# Patient Record
Sex: Female | Born: 1980 | Race: White | Hispanic: No | Marital: Married | Smoking: Never smoker
Health system: Southern US, Community
[De-identification: ages and names within clinical notes are randomized; demographics above are authoritative.]

## PROBLEM LIST (undated history)

## (undated) DIAGNOSIS — Z98891 History of uterine scar from previous surgery: Secondary | ICD-10-CM

---

## 2004-07-09 ENCOUNTER — Other Ambulatory Visit: Admission: RE | Admit: 2004-07-09 | Discharge: 2004-07-09 | Payer: Self-pay | Admitting: Family Medicine

## 2012-04-26 LAB — OB RESULTS CONSOLE RPR
RPR: NONREACTIVE
RPR: NONREACTIVE

## 2012-04-26 LAB — OB RESULTS CONSOLE ANTIBODY SCREEN: Antibody Screen: NEGATIVE

## 2012-04-26 LAB — OB RESULTS CONSOLE HIV ANTIBODY (ROUTINE TESTING)
HIV: NONREACTIVE
HIV: NONREACTIVE

## 2012-04-26 LAB — OB RESULTS CONSOLE RUBELLA ANTIBODY, IGM: Rubella: IMMUNE

## 2012-10-02 LAB — OB RESULTS CONSOLE GBS: GBS: POSITIVE

## 2012-11-03 ENCOUNTER — Inpatient Hospital Stay (HOSPITAL_COMMUNITY)
Admission: AD | Admit: 2012-11-03 | Payer: Managed Care, Other (non HMO) | Source: Ambulatory Visit | Admitting: Obstetrics and Gynecology

## 2012-11-03 LAB — OB RESULTS CONSOLE GC/CHLAMYDIA: Gonorrhea: NEGATIVE

## 2012-11-17 ENCOUNTER — Telehealth (HOSPITAL_COMMUNITY): Payer: Self-pay | Admitting: *Deleted

## 2012-11-17 ENCOUNTER — Encounter (HOSPITAL_COMMUNITY): Payer: Self-pay | Admitting: *Deleted

## 2012-11-17 NOTE — Telephone Encounter (Signed)
Preadmission screen  

## 2012-11-20 ENCOUNTER — Inpatient Hospital Stay (HOSPITAL_COMMUNITY)
Admission: RE | Admit: 2012-11-20 | Discharge: 2012-11-23 | DRG: 766 | Disposition: A | Discharge status: Home / Self Care | Payer: 59 | Source: Ambulatory Visit | Attending: Obstetrics | Admitting: Obstetrics

## 2012-11-20 ENCOUNTER — Encounter (HOSPITAL_COMMUNITY): Payer: Self-pay

## 2012-11-20 VITALS — BP 112/67 | HR 80 | Temp 97.9°F | Resp 18 | Ht 67.0 in | Wt 176.0 lb

## 2012-11-20 DIAGNOSIS — O9903 Anemia complicating the puerperium: Secondary | ICD-10-CM | POA: Diagnosis not present

## 2012-11-20 DIAGNOSIS — Z98891 History of uterine scar from previous surgery: Secondary | ICD-10-CM | POA: Diagnosis not present

## 2012-11-20 DIAGNOSIS — O99892 Other specified diseases and conditions complicating childbirth: Secondary | ICD-10-CM | POA: Diagnosis present

## 2012-11-20 DIAGNOSIS — O480 Post-term pregnancy: Principal | ICD-10-CM | POA: Diagnosis present

## 2012-11-20 DIAGNOSIS — O34219 Maternal care for unspecified type scar from previous cesarean delivery: Secondary | ICD-10-CM | POA: Diagnosis present

## 2012-11-20 DIAGNOSIS — D649 Anemia, unspecified: Secondary | ICD-10-CM | POA: Diagnosis not present

## 2012-11-20 DIAGNOSIS — Z22330 Carrier of Group B streptococcus: Secondary | ICD-10-CM

## 2012-11-20 DIAGNOSIS — O9902 Anemia complicating childbirth: Secondary | ICD-10-CM | POA: Diagnosis not present

## 2012-11-20 HISTORY — DX: History of uterine scar from previous surgery: Z98.891

## 2012-11-20 LAB — CBC
Hemoglobin: 11.2 g/dL — ABNORMAL LOW (ref 12.0–15.0)
MCH: 31.3 pg (ref 26.0–34.0)
MCV: 92.2 fL (ref 78.0–100.0)
RBC: 3.58 MIL/uL — ABNORMAL LOW (ref 3.87–5.11)

## 2012-11-20 LAB — TYPE AND SCREEN: Antibody Screen: NEGATIVE

## 2012-11-20 MED ORDER — CITRIC ACID-SODIUM CITRATE 334-500 MG/5ML PO SOLN
30.0000 mL | ORAL | Status: DC | PRN
  Administered 2012-11-21: 30 mL via ORAL
  Filled 2012-11-20: qty 15

## 2012-11-20 MED ORDER — OXYTOCIN 40 UNITS IN LACTATED RINGERS INFUSION - SIMPLE MED: 62.5000 mL/h | INTRAVENOUS | Status: DC

## 2012-11-20 MED ORDER — ACETAMINOPHEN 325 MG PO TABS: 650.0000 mg | ORAL_TABLET | ORAL | Status: DC | PRN

## 2012-11-20 MED ORDER — LIDOCAINE HCL (PF) 1 % IJ SOLN: 30.0000 mL | INTRAMUSCULAR | Status: DC | PRN

## 2012-11-20 MED ORDER — ONDANSETRON HCL 4 MG/2ML IJ SOLN: 4.0000 mg | Freq: Four times a day (QID) | INTRAMUSCULAR | Status: DC | PRN

## 2012-11-20 MED ORDER — PENICILLIN G POTASSIUM 5000000 UNITS IJ SOLR
5.0000 10*6.[IU] | Freq: Once | INTRAVENOUS | Status: AC
  Administered 2012-11-20: 5 10*6.[IU] via INTRAVENOUS
  Filled 2012-11-20: qty 5

## 2012-11-20 MED ORDER — LACTATED RINGERS IV SOLN
INTRAVENOUS | Status: DC
  Administered 2012-11-20: 21:00:00 via INTRAVENOUS

## 2012-11-20 MED ORDER — OXYTOCIN BOLUS FROM INFUSION: 500.0000 mL | INTRAVENOUS | Status: DC

## 2012-11-20 MED ORDER — LACTATED RINGERS IV SOLN: 500.0000 mL | INTRAVENOUS | Status: DC | PRN

## 2012-11-20 MED ORDER — OXYCODONE-ACETAMINOPHEN 5-325 MG PO TABS: 1.0000 | ORAL_TABLET | ORAL | Status: DC | PRN

## 2012-11-20 MED ORDER — IBUPROFEN 600 MG PO TABS: 600.0000 mg | ORAL_TABLET | Freq: Four times a day (QID) | ORAL | Status: DC | PRN

## 2012-11-20 MED ORDER — PENICILLIN G POTASSIUM 5000000 UNITS IJ SOLR
2.5000 10*6.[IU] | INTRAMUSCULAR | Status: DC
  Administered 2012-11-21 (×3): 2.5 10*6.[IU] via INTRAVENOUS
  Filled 2012-11-20 (×7): qty 2.5

## 2012-11-20 NOTE — Progress Notes (Signed)
Comfortable with irregular contractions. O: VSS      fhts category 1, baseline 155 bpm      abd soft between uc      Contractions 1: 4 mins       Vag: 1.5/ 60%/-2, Vx       Placement of  Cervical balloon with 60 ml balloon - to water traction      1st dose of Pen G - GBS prophylaxis infusing A: IOL, TOLAC, desired VBAC at [redacted]w[redacted]d Cervical balloon in place.       P: EFM continuous until Cervical Balloon has fallen out.     Plan to AROM s/p cervical Balloon     GBS prophylaxis      Earl Gala, CNM.

## 2012-11-20 NOTE — H&P (Signed)
Shari Chang is a 31 y.o. female, G2P1 at [redacted]w[redacted]d, presenting for IOL with desire to [facility]. Plan to place a Cervical Balloon tonight. Previous C/S at 40 weeks, [place].   There is no problem list on file for this patient.   History of present pregnancy: Patient entered care at [redacted]w[redacted]d   [facility] of 11/03/12 was established by LMP and USS.   Anatomy scan: With Ultra screen and NT testing. Anantomy weeks [redacted]w[redacted]d, with normal findings and an anterior placenta.   Additional [place] evaluations: AFI with unilateral CP cyst. [place] at 28 weeks and CP cyst resolved.   Significant prenatal events:Post dates - at [redacted]w[redacted]d  As per patient request. Had had NSTs and BPP's evaluation this weeks once GA at 42 weeks   Last evaluation:  11/19/12   OB History    Grav Para Term Preterm Abortions TAB SAB Ect Mult Living   2 1 1       1      No past medical history on file. Past Surgical History  Procedure Date  . Cesarean section    Family History: family history includes Asthma in her sister; Cancer in her father; Endometriosis in her maternal grandmother, mother, and sister; and Rheum arthritis in her sister. Social History:  reports that she has never smoked. She has never used smokeless tobacco. She reports that she does not drink alcohol or use illicit drugs.   Prenatal Transfer Tool  Maternal Diabetes: No Genetic Screening: Normal Maternal Ultrasounds/Referrals: Abnormal:  Findings:   Other: post dates surveillance Fetal Ultrasounds or other Referrals:  None Maternal Substance Abuse:  No Significant Maternal Medications:  None Significant Maternal Lab Results: None    ROS:  Affect: AAO x 3 Lungs; CTAB CV: RRR Abdomen: Gravid and N/T soft between contractions GU: no problems voiding GI: normal Extremities: No edema , no swelling bilaterally.  No Known Allergies     There were no vitals taken for this visit.  Chest clear Heart RRR without murmur Abd gravid, NT, FH to dates Pelvic: unproven as  previous LTCS for FTD Ext: normal  FHR: 140 bpm UCs:  1: 5 mins  Prenatal labs: ABO, Rh: A/Positive/-- (05/12 0000) Antibody: Negative (05/12 0000) Rubella:    RPR: Nonreactive (05/12 0000)  HBsAg: Negative (05/12 0000)  HIV: Non-reactive (05/12 0000)  GBS: Positive (10/18 0000) Sickle cell/Hgb electrophoresis: no completed Pap:  norm GC:  neg Chlamydia:  neg Genetic screenings:  neg Glucola:  normal BPP: 8/8 11/19/12       Assessment/Plan: IUP at [redacted]w[redacted]d Postdates IOL and TOLAC, previous PLTCS for FTD  Plan: IOL with Cervical balloon over night and AROM ZOX:[id number]- GBS prophylaxis - Pen G  Collaboration with Dr. Ernestina Penna as VBAC  Jeni Duling, DENISECNM. 11/20/2012, 7:53 PM

## 2012-11-20 NOTE — Progress Notes (Signed)
Called CNM for orders for intermittent monitoring, Saline lock between PCN doses. Also reported to CNM foley bulb fell out. CNM aware- orders to check pt and allow pt to ambulate or get in tub. Per pt choice

## 2012-11-20 NOTE — Progress Notes (Signed)
Provider attempted to place foley bulb, unsuccessful. Will be back to try again

## 2012-11-21 ENCOUNTER — Encounter (HOSPITAL_COMMUNITY): Payer: Self-pay | Admitting: Anesthesiology

## 2012-11-21 ENCOUNTER — Inpatient Hospital Stay (HOSPITAL_COMMUNITY): Payer: 59 | Admitting: Anesthesiology

## 2012-11-21 ENCOUNTER — Encounter (HOSPITAL_COMMUNITY): Payer: Self-pay

## 2012-11-21 ENCOUNTER — Encounter (HOSPITAL_COMMUNITY)
Admission: RE | Disposition: A | Discharge status: Home / Self Care | Payer: Self-pay | Source: Ambulatory Visit | Attending: Obstetrics

## 2012-11-21 LAB — ABO/RH: ABO/RH(D): A POS

## 2012-11-21 SURGERY — Surgical Case
Anesthesia: Spinal | Site: Abdomen | Wound class: Clean Contaminated

## 2012-11-21 MED ORDER — LIDOCAINE-EPINEPHRINE 2 %-1:100000 IJ SOLN: INTRAMUSCULAR | Status: DC | PRN

## 2012-11-21 MED ORDER — MORPHINE SULFATE 0.5 MG/ML IJ SOLN
INTRAMUSCULAR | Status: AC
  Filled 2012-11-21: qty 10

## 2012-11-21 MED ORDER — METOCLOPRAMIDE HCL 5 MG/ML IJ SOLN: 10.0000 mg | Freq: Three times a day (TID) | INTRAMUSCULAR | Status: DC | PRN

## 2012-11-21 MED ORDER — DIPHENHYDRAMINE HCL 50 MG/ML IJ SOLN: 12.5000 mg | INTRAMUSCULAR | Status: DC | PRN

## 2012-11-21 MED ORDER — ONDANSETRON HCL 4 MG PO TABS: 4.0000 mg | ORAL_TABLET | ORAL | Status: DC | PRN

## 2012-11-21 MED ORDER — TETANUS-DIPHTH-ACELL PERTUSSIS 5-2.5-18.5 LF-MCG/0.5 IM SUSP: 0.5000 mL | Freq: Once | INTRAMUSCULAR | Status: DC

## 2012-11-21 MED ORDER — DIPHENHYDRAMINE HCL 25 MG PO CAPS: 25.0000 mg | ORAL_CAPSULE | ORAL | Status: DC | PRN

## 2012-11-21 MED ORDER — SIMETHICONE 80 MG PO CHEW
80.0000 mg | CHEWABLE_TABLET | Freq: Three times a day (TID) | ORAL | Status: DC
  Administered 2012-11-21 – 2012-11-23 (×6): 80 mg via ORAL

## 2012-11-21 MED ORDER — FENTANYL CITRATE 0.05 MG/ML IJ SOLN
INTRAMUSCULAR | Status: DC | PRN
  Administered 2012-11-21: 25 ug via INTRATHECAL

## 2012-11-21 MED ORDER — KETOROLAC TROMETHAMINE 30 MG/ML IJ SOLN
30.0000 mg | Freq: Four times a day (QID) | INTRAMUSCULAR | Status: AC | PRN
  Administered 2012-11-21: 30 mg via INTRAVENOUS

## 2012-11-21 MED ORDER — SENNOSIDES-DOCUSATE SODIUM 8.6-50 MG PO TABS
2.0000 | ORAL_TABLET | Freq: Every day | ORAL | Status: DC
  Administered 2012-11-21 – 2012-11-22 (×2): 2 via ORAL

## 2012-11-21 MED ORDER — MEPERIDINE HCL 25 MG/ML IJ SOLN: 6.2500 mg | INTRAMUSCULAR | Status: DC | PRN

## 2012-11-21 MED ORDER — NALBUPHINE HCL 10 MG/ML IJ SOLN
5.0000 mg | INTRAMUSCULAR | Status: DC | PRN
  Filled 2012-11-21: qty 1

## 2012-11-21 MED ORDER — ONDANSETRON HCL 4 MG/2ML IJ SOLN: 4.0000 mg | Freq: Three times a day (TID) | INTRAMUSCULAR | Status: DC | PRN

## 2012-11-21 MED ORDER — LACTATED RINGERS IV SOLN
INTRAVENOUS | Status: DC
  Administered 2012-11-21: 22:00:00 via INTRAVENOUS

## 2012-11-21 MED ORDER — DIBUCAINE 1 % RE OINT: 1.0000 "application " | TOPICAL_OINTMENT | RECTAL | Status: DC | PRN

## 2012-11-21 MED ORDER — CEFAZOLIN SODIUM-DEXTROSE 2-3 GM-% IV SOLR
2.0000 g | Freq: Once | INTRAVENOUS | Status: AC
  Administered 2012-11-21: 2 g via INTRAVENOUS
  Filled 2012-11-21: qty 50

## 2012-11-21 MED ORDER — DIPHENHYDRAMINE HCL 25 MG PO CAPS: 25.0000 mg | ORAL_CAPSULE | Freq: Four times a day (QID) | ORAL | Status: DC | PRN

## 2012-11-21 MED ORDER — NALOXONE HCL 0.4 MG/ML IJ SOLN: 0.4000 mg | INTRAMUSCULAR | Status: DC | PRN

## 2012-11-21 MED ORDER — FENTANYL CITRATE 0.05 MG/ML IJ SOLN
INTRAMUSCULAR | Status: AC
  Filled 2012-11-21: qty 2

## 2012-11-21 MED ORDER — ZOLPIDEM TARTRATE 5 MG PO TABS: 5.0000 mg | ORAL_TABLET | Freq: Every evening | ORAL | Status: DC | PRN

## 2012-11-21 MED ORDER — PHENYLEPHRINE 40 MCG/ML (10ML) SYRINGE FOR IV PUSH (FOR BLOOD PRESSURE SUPPORT)
PREFILLED_SYRINGE | INTRAVENOUS | Status: AC
  Filled 2012-11-21: qty 15

## 2012-11-21 MED ORDER — LANOLIN HYDROUS EX OINT: 1.0000 "application " | TOPICAL_OINTMENT | CUTANEOUS | Status: DC | PRN

## 2012-11-21 MED ORDER — OXYTOCIN 40 UNITS IN LACTATED RINGERS INFUSION - SIMPLE MED: 62.5000 mL/h | INTRAVENOUS | Status: AC

## 2012-11-21 MED ORDER — SIMETHICONE 80 MG PO CHEW: 80.0000 mg | CHEWABLE_TABLET | ORAL | Status: DC | PRN

## 2012-11-21 MED ORDER — DIPHENHYDRAMINE HCL 50 MG/ML IJ SOLN: 25.0000 mg | INTRAMUSCULAR | Status: DC | PRN

## 2012-11-21 MED ORDER — FENTANYL CITRATE 0.05 MG/ML IJ SOLN: 25.0000 ug | INTRAMUSCULAR | Status: DC | PRN

## 2012-11-21 MED ORDER — 0.9 % SODIUM CHLORIDE (POUR BTL) OPTIME
TOPICAL | Status: DC | PRN
  Administered 2012-11-21: 1000 mL

## 2012-11-21 MED ORDER — OXYCODONE-ACETAMINOPHEN 5-325 MG PO TABS
1.0000 | ORAL_TABLET | ORAL | Status: DC | PRN
  Administered 2012-11-22: 1 via ORAL
  Filled 2012-11-21: qty 1

## 2012-11-21 MED ORDER — ONDANSETRON HCL 4 MG/2ML IJ SOLN: 4.0000 mg | INTRAMUSCULAR | Status: DC | PRN

## 2012-11-21 MED ORDER — SCOPOLAMINE 1 MG/3DAYS TD PT72
MEDICATED_PATCH | TRANSDERMAL | Status: AC
  Administered 2012-11-21: 1.5 mg via TRANSDERMAL
  Filled 2012-11-21: qty 1

## 2012-11-21 MED ORDER — IBUPROFEN 600 MG PO TABS
600.0000 mg | ORAL_TABLET | Freq: Four times a day (QID) | ORAL | Status: DC
  Administered 2012-11-21 – 2012-11-23 (×6): 600 mg via ORAL
  Filled 2012-11-21 (×2): qty 1

## 2012-11-21 MED ORDER — IBUPROFEN 600 MG PO TABS
600.0000 mg | ORAL_TABLET | Freq: Four times a day (QID) | ORAL | Status: DC | PRN
  Filled 2012-11-21 (×4): qty 1

## 2012-11-21 MED ORDER — EPHEDRINE SULFATE 50 MG/ML IJ SOLN
INTRAMUSCULAR | Status: DC | PRN
  Administered 2012-11-21 (×4): 10 mg via INTRAVENOUS

## 2012-11-21 MED ORDER — MORPHINE SULFATE (PF) 0.5 MG/ML IJ SOLN
INTRAMUSCULAR | Status: DC | PRN
  Administered 2012-11-21: .15 mg via INTRATHECAL

## 2012-11-21 MED ORDER — WITCH HAZEL-GLYCERIN EX PADS: 1.0000 "application " | MEDICATED_PAD | CUTANEOUS | Status: DC | PRN

## 2012-11-21 MED ORDER — NALOXONE HCL 1 MG/ML IJ SOLN: 1.0000 ug/kg/h | INTRAVENOUS | Status: DC | PRN

## 2012-11-21 MED ORDER — OXYTOCIN 10 UNIT/ML IJ SOLN
40.0000 [IU] | INTRAVENOUS | Status: DC | PRN
  Administered 2012-11-21: 40 [IU] via INTRAVENOUS

## 2012-11-21 MED ORDER — SCOPOLAMINE 1 MG/3DAYS TD PT72
1.0000 | MEDICATED_PATCH | Freq: Once | TRANSDERMAL | Status: DC
  Administered 2012-11-21: 1.5 mg via TRANSDERMAL

## 2012-11-21 MED ORDER — PHENYLEPHRINE HCL 10 MG/ML IJ SOLN
INTRAMUSCULAR | Status: DC | PRN
Start: 2012-11-21 — End: 2012-11-21
  Administered 2012-11-21: 40 ug via INTRAVENOUS
  Administered 2012-11-21 (×5): 80 ug via INTRAVENOUS
  Administered 2012-11-21: 40 ug via INTRAVENOUS
  Administered 2012-11-21 (×2): 80 ug via INTRAVENOUS

## 2012-11-21 MED ORDER — MENTHOL 3 MG MT LOZG: 1.0000 | LOZENGE | OROMUCOSAL | Status: DC | PRN

## 2012-11-21 MED ORDER — SODIUM CHLORIDE 0.9 % IJ SOLN: 3.0000 mL | INTRAMUSCULAR | Status: DC | PRN

## 2012-11-21 MED ORDER — LACTATED RINGERS IV SOLN
INTRAVENOUS | Status: DC | PRN
  Administered 2012-11-21 (×3): via INTRAVENOUS

## 2012-11-21 MED ORDER — KETOROLAC TROMETHAMINE 30 MG/ML IJ SOLN: 30.0000 mg | Freq: Four times a day (QID) | INTRAMUSCULAR | Status: AC | PRN

## 2012-11-21 MED ORDER — ONDANSETRON HCL 4 MG/2ML IJ SOLN
INTRAMUSCULAR | Status: AC
  Filled 2012-11-21: qty 2

## 2012-11-21 MED ORDER — BUPIVACAINE IN DEXTROSE 0.75-8.25 % IT SOLN
INTRATHECAL | Status: DC | PRN
  Administered 2012-11-21: 1.7 mL via INTRATHECAL

## 2012-11-21 MED ORDER — ONDANSETRON HCL 4 MG/2ML IJ SOLN
INTRAMUSCULAR | Status: DC | PRN
  Administered 2012-11-21: 4 mg via INTRAVENOUS

## 2012-11-21 MED ORDER — KETOROLAC TROMETHAMINE 30 MG/ML IJ SOLN
INTRAMUSCULAR | Status: AC
  Administered 2012-11-21: 30 mg via INTRAVENOUS
  Filled 2012-11-21: qty 1

## 2012-11-21 MED ORDER — OXYTOCIN 10 UNIT/ML IJ SOLN
INTRAMUSCULAR | Status: AC
  Filled 2012-11-21: qty 4

## 2012-11-21 MED ORDER — PRENATAL MULTIVITAMIN CH
1.0000 | ORAL_TABLET | Freq: Every day | ORAL | Status: DC
  Filled 2012-11-21 (×2): qty 1

## 2012-11-21 SURGICAL SUPPLY — 31 items
CLOTH BEACON ORANGE TIMEOUT ST (SAFETY) ×2 IMPLANT
CONTAINER PREFILL 10% NBF 15ML (MISCELLANEOUS) IMPLANT
DRSG OPSITE POSTOP 4X10 (GAUZE/BANDAGES/DRESSINGS) IMPLANT
DURAPREP 26ML APPLICATOR (WOUND CARE) ×2 IMPLANT
ELECT REM PT RETURN 9FT ADLT (ELECTROSURGICAL) ×2
ELECTRODE REM PT RTRN 9FT ADLT (ELECTROSURGICAL) ×1 IMPLANT
EXTRACTOR VACUUM KIWI (MISCELLANEOUS) IMPLANT
EXTRACTOR VACUUM M CUP 4 TUBE (SUCTIONS) IMPLANT
GLOVE BIO SURGEON STRL SZ 6.5 (GLOVE) ×2 IMPLANT
GLOVE BIOGEL PI IND STRL 7.0 (GLOVE) ×2 IMPLANT
GLOVE BIOGEL PI INDICATOR 7.0 (GLOVE) ×2
GOWN PREVENTION PLUS LG XLONG (DISPOSABLE) ×6 IMPLANT
KIT ABG SYR 3ML LUER SLIP (SYRINGE) IMPLANT
NEEDLE HYPO 25X5/8 SAFETYGLIDE (NEEDLE) IMPLANT
NS IRRIG 1000ML POUR BTL (IV SOLUTION) ×2 IMPLANT
PACK C SECTION WH (CUSTOM PROCEDURE TRAY) ×2 IMPLANT
PAD OB MATERNITY 4.3X12.25 (PERSONAL CARE ITEMS) IMPLANT
SLEEVE SCD COMPRESS KNEE MED (MISCELLANEOUS) IMPLANT
STAPLER VISISTAT 35W (STAPLE) IMPLANT
STRIP CLOSURE SKIN 1/2X4 (GAUZE/BANDAGES/DRESSINGS) IMPLANT
SUT MON AB 4-0 PS1 27 (SUTURE) IMPLANT
SUT PLAIN 0 NONE (SUTURE) IMPLANT
SUT PLAIN 2 0 XLH (SUTURE) IMPLANT
SUT VIC AB 0 CT1 36 (SUTURE) ×2 IMPLANT
SUT VIC AB 0 CTX 36 (SUTURE) ×4
SUT VIC AB 0 CTX36XBRD ANBCTRL (SUTURE) ×4 IMPLANT
SUT VIC AB 2-0 CT1 27 (SUTURE) ×1
SUT VIC AB 2-0 CT1 TAPERPNT 27 (SUTURE) ×1 IMPLANT
TOWEL OR 17X24 6PK STRL BLUE (TOWEL DISPOSABLE) ×4 IMPLANT
TRAY FOLEY CATH 14FR (SET/KITS/TRAYS/PACK) IMPLANT
WATER STERILE IRR 1000ML POUR (IV SOLUTION) ×2 IMPLANT

## 2012-11-21 NOTE — Anesthesia Postprocedure Evaluation (Signed)
  Anesthesia Post-op Note  Patient: Shari Chang  Procedure(s) Performed: Procedure(s) (LRB) with comments: CESAREAN SECTION (N/A)  Patient Location: PACU  Anesthesia Type:Spinal  Level of Consciousness: awake, alert  and oriented  Airway and Oxygen Therapy: Patient Spontanous Breathing  Post-op Pain: none  Post-op Assessment: Post-op Vital signs reviewed, Patient's Cardiovascular Status Stable, Respiratory Function Stable, Patent Airway, No signs of Nausea or vomiting, Pain level controlled, No headache, No backache, No residual numbness and No residual motor weakness  Post-op Vital Signs: Reviewed and stable  Complications: No apparent anesthesia complications

## 2012-11-21 NOTE — Op Note (Signed)
PATIENT: Shari Chang 31 y.o. female  PRE-OPERATIVE DIAGNOSIS: Failure to Progress, failed TOLAC  POST-OPERATIVE DIAGNOSIS: Failure to Progress  PROCEDURE: Procedure(s) (LRB) with comments:  CESAREAN SECTION (N/A)  SURGEON: Surgeon(s) and Role:  * Jarrad Mclees A. Ernestina Penna, MD - Primary  PHYSICIAN ASSISTANT:  ASSISTANTS: Earl Gala, CNM  ANESTHESIA: spinal  EBL: Total I/O  In: 1800 [I.V.:1800]  Out: 875 [Urine:375; Blood:500]  BLOOD ADMINISTERED:none  DRAINS: Urinary Catheter (Foley)  LOCAL MEDICATIONS USED: NONE  SPECIMEN: No Specimen  DISPOSITION OF SPECIMEN: N/A  COUNTS: YES  TOURNIQUET: * No tourniquets in log *  DICTATION: .Note written in EPIC  PLAN OF CARE: Admit to inpatient  PATIENT DISPOSITION: PACU - hemodynamically stable.  Delay start of Pharmacological VTE agent (>24hrs) due to surgical blood loss or risk of bleeding: yes   Findings:  @BABYSEXEBC @ infant,  APGAR (1 MIN): 9   APGAR (5 MINS): 9   APGAR (10 MINS):    Uterus tubes and ovaries, clear amniotic fluid, normal placenta with three-vessel cord. Of  note the lower uterine segment was extremely thin EBL: 500 cc Antibiotics:  2 g of Ancef  Complications: none  Indications: This is a 31 y.o. year-old, G2 P1 001  At [redacted]w[redacted]d admitted for postdates induction of labor. Attempting vaginal birth after cesarean section the patient had cervical Foley, AROM, nipple stimulation with the inability to achieve labor. Patient declined trial of Pitocin.  Risks benefits and alternatives of the procedure were discussed with the patient who agreed to proceed  Procedure:  After informed consent was obtained the patient was taken to the operating room where spinal  anesthesia was initiated.  She was prepped and draped in the normal sterile fashion in dorsal supine position with a leftward tilt.  A foley catheter was inserted sterilely into the bladder. Gloves were changes and attention was turned to the patient's abdomen. A Pfannenstiel  skin incision was made 2 cm above the pubic symphysis in the midline with the scalpel. T Dissection was carried down with the Bovie cautery until the fascia was reached. The fascia was incised in the midline. The incision was extended laterally with the Mayo scissors. The inferior aspect of the fascial incision was grasped with the Coker clamps, elevated up and the underlying rectus muscles were dissected off sharply. The superior aspect of the fascial incision was grasped with the Coker clamps elevated up and the underlying rectus muscles were dissected off sharply.  The peritoneum was entered bluntly. The peritoneal incision was extended superiorly and inferiorly with good visualization of the bladder. The bladder blade was inserted, the vesicouterine peritoneum was identified grasped with the pickups and entered sharply. The bladder flap was created digitally the bladder blade was reinserted. Palpation was done to assess the fetal position and the location of the uterine vessels. The lower segment of the uterus was incised sharply with the scalpel and extended superiorly and laterally with the bandage scissors. The infant also was grasped brought to the incision,  rotated and the infant was delivered with fundal pressure. The nose and mouth were bulb suctioned. The cord was clamped and cut. The infant was handed off to the waiting pediatrician. The placenta was expressed. The uterus was exteriorized. The uterus was cleared of all clots and debris. The uterine incision was repaired with 0 Vicryl in a running locked fashion.  A second layer of the same suture was used in an imbricating fashion to obtain excellent hemostasis. Figure-of-eight was placed in the midpoint of the incision  for additional hemostasis.  The uterus was then returned to the abdomen, the gutters were cleared of all clots and debris. The uterine incision was reinspected and found to be hemostatic. The peritoneum was grasped and closed with 2-0  Vicryl in a running fashion. The cut muscle edges and the underside of the fascia were inspected and found to be hemostatic. The fascia was closed with 0 Vicryl in two halves. The subcutaneous tissue was irrigated. A single layer. The skin was closed with a 4-0 Monocryl in a single layer. The patient tolerated the procedure well. Sponge lap and needle counts were correct x3 and patient was taken to the recovery room in a stable condition.  Rheba Diamond A. 11/21/2012 2:55 PM

## 2012-11-21 NOTE — Progress Notes (Signed)
Comfortable, and remains anxious re lack of progress O VSS Filed Vitals:   11/21/12 0444  BP: 119/68  Pulse: 98  Temp:   Resp: 18         fhts category 1, baseline 145 bpm  Cat1      abd soft between uc      Contractions 1: 2 mins      Vag: 4/80%/-2, Posterior, medium consistency, AROM - scant return clear slight blood stained fluid. A AROM P patient can ambulate/use Birthing Pool/ nipple stimulation    Will recheck in approx. 2 - 3 hrs or as needed    Intermittent monitoring    Earl Gala, CNM.  Comfortable, some pressure O VSS      fhts category 1      abd soft between uc      Contractions       Vag  A P continue care  Earl Gala, CNM.

## 2012-11-21 NOTE — Progress Notes (Addendum)
Comfortable, some pressure O VSS      fhts category 1      abd soft between uc      Contractions       Vag  A P continue care  Earl Gala, CNM.  Comfortable, sitting up and using nipple stimulation with breast pump.  Collaborate management with Dr Ernestina Penna and requested her to chat with patient. O VSS Filed Vitals:   11/21/12 0810  BP: 123/74  Pulse: 111  Temp:   Resp: 18         Fhts category 1 baseline 145 bpm , intermittent monitoring      abd soft between uc, increased contraction with Nipple Stimulation but no labor pattern established       Dr. Ernestina Penna to bedside to discuss options of possible use of pitocin and possibility of RLTCS. A:   Patient is leaning towards RLTCS  - patient will discuss with husband. P    continue care - conservative management at presentation.  Earl Gala, CNM.   \ Met with patient and husband to discuss progress and plan of care. Prior notes reviewed. In short patient with prior cesarean section for arrest of dilation and failed induction of labor. Patient has been managing expectantly with continued fetal surveillance past 42 weeks. Patient has been made aware of the recommendations of delivery by 42 weeks. Outpatient fetal surveillance has been reassuring the patient presented for induction of labor yesterday. Patient had a cervical Foley bulb, artificial rupture of membranes, nipple stimulation. Patient has been unable to achieve active labor. Discussion was had with patient regarding risk and benefits of Pitocin induction of labor. I discussed with patient that while there are some risks to Pitocin this would be the next step if she continued to desire attempted vaginal delivery. Patient sided numerous reasons including concern for the baby, concern of pain with Pitocin and that she did not have a good experience with Pitocin in the past. Patient now would like to proceed with repeat cesarean section. Risks benefits and alternative the  procedure discussed with patient. Patient aware of risks of bleeding, infection, further damage to internal organs, scar tissue formation.  Puneet Selden A. 11/21/2012 11:06 AM

## 2012-11-21 NOTE — Progress Notes (Signed)
Comfortable, some pressure from Foley Bulb O VSS      fhts category 1, 145 bpm - intermittent monitoring      abd soft between uc 1: 3 - 4 mins with feeling of pressure    A: Patient resting with  Foley bulb in place - no distress. P continue care  Earl Gala, CNM.

## 2012-11-21 NOTE — Brief Op Note (Signed)
11/20/2012 - 11/21/2012  2:46 PM  PATIENT:  Shari Chang  31 y.o. female  PRE-OPERATIVE DIAGNOSIS:  Failure to Progress, failed TOLAC  POST-OPERATIVE DIAGNOSIS:  Failure to Progress  PROCEDURE:  Procedure(s) (LRB) with comments: CESAREAN SECTION (N/A)  SURGEON:  Surgeon(s) and Role:    Tresa Endo A. Ernestina Penna, MD - Primary  PHYSICIAN ASSISTANT:   ASSISTANTS: Earl Gala, CNM   ANESTHESIA:   spinal  EBL:  Total I/O In: 1800 [I.V.:1800] Out: 875 [Urine:375; Blood:500]  BLOOD ADMINISTERED:none  DRAINS: Urinary Catheter (Foley)   LOCAL MEDICATIONS USED:  NONE  SPECIMEN:  No Specimen  DISPOSITION OF SPECIMEN:  N/A  COUNTS:  YES  TOURNIQUET:  * No tourniquets in log *  DICTATION: .Note written in EPIC  PLAN OF CARE: Admit to inpatient   PATIENT DISPOSITION:  PACU - hemodynamically stable.   Delay start of Pharmacological VTE agent (>24hrs) due to surgical blood loss or risk of bleeding: yes

## 2012-11-21 NOTE — Anesthesia Preprocedure Evaluation (Addendum)
Anesthesia Evaluation  Patient identified by MRN, date of birth, ID band Patient awake    Reviewed: Allergy & Precautions, H&P , NPO status , Patient's Chart, lab work & pertinent test results  Airway Mallampati: III TM Distance: >3 FB Neck ROM: Full    Dental No notable dental hx. (+) Teeth Intact   Pulmonary neg pulmonary ROS,  breath sounds clear to auscultation  Pulmonary exam normal       Cardiovascular negative cardio ROS  Rhythm:Regular Rate:Normal     Neuro/Psych negative neurological ROS  negative psych ROS   GI/Hepatic negative GI ROS, Neg liver ROS,   Endo/Other  negative endocrine ROS  Renal/GU negative Renal ROS  negative genitourinary   Musculoskeletal negative musculoskeletal ROS (+)   Abdominal   Peds  Hematology negative hematology ROS (+)   Anesthesia Other Findings   Reproductive/Obstetrics (+) Pregnancy                          Anesthesia Physical Anesthesia Plan  ASA: II and emergent  Anesthesia Plan: Spinal   Post-op Pain Management:    Induction: Intravenous  Airway Management Planned: Natural Airway  Additional Equipment:   Intra-op Plan:   Post-operative Plan:   Informed Consent: I have reviewed the patients History and Physical, chart, labs and discussed the procedure including the risks, benefits and alternatives for the proposed anesthesia with the patient or authorized representative who has indicated his/her understanding and acceptance.   Dental advisory given  Plan Discussed with: CRNA, Anesthesiologist and Surgeon  Anesthesia Plan Comments:         Anesthesia Quick Evaluation

## 2012-11-21 NOTE — Progress Notes (Signed)
Comfortable, some pressure and foley bulb has come out. Bulb in vagina O VSS      Filed Vitals:   11/20/12 2322  BP: 111/62  Pulse: 88  Temp: 98.6 F (37 C)  Resp: 18         fhts category 1, baseline 145 bpm, intermittent monitoring      abd soft between, uc 1: 4 - 5 mins      Vag: 4 - 5 cms, Funnelling Cx, medium consistency       Vx still slight high for AROM.      Will ambulate for 1 hr to see if head will descend. A   S/p foley bulb      Ambulating at present to assist descent of V       P continue care    Working towards Vx descent and AROM  Earl Gala, CNM.

## 2012-11-21 NOTE — Progress Notes (Signed)
Comfortable with discussion and decision  To proceed to C/S Consent obtained and all conditions and sequelae of C//S operative procedure discussed with the patient.  Prep for C/S O VSS Filed Vitals:   11/21/12 1042  BP: 111/73  Pulse: 92  Temp:   Resp:          fhts category 1, 145 bpm, baseline Cat 1       abd soft between uc      Contractions: 1: 10 mins - subsiding      Vag unchanged at 3- 4 cms, AROM - clear     Has continued Pen G for GBS prophylaxis   A: Prep for RLTCS. P Failed IOL - prep for RLTCS with Dr Ernestina Penna D.Connye Burkitt, CNM 1st assist.  Earl Gala, CNM.

## 2012-11-21 NOTE — Progress Notes (Signed)
Comfortable, had short nap and now feeling better. Now [redacted]w[redacted]d IOL x 10 hrs O VSS Filed Vitals:   11/21/12 0810  BP: 123/74  Pulse: 111  Temp:   Resp: 18         fhts category 1, Baseline 145 bpm       abd soft between uc      Contractions : 1: 5 - 7 min, very mild      Vag: 4/50%/-2, posterior and medium. Not a laboring Cx  A  IOLs/p Foley Bulb, ambulation and nap P Intermittent EFM      Ambulation     Nipple simulation     Minimal progress over night.  Earl Gala, CNM.

## 2012-11-21 NOTE — Anesthesia Procedure Notes (Signed)
Spinal  Patient location during procedure: OR Start time: 11/21/2012 1:44 PM Staffing Anesthesiologist: Amandeep Nesmith A. Performed by: anesthesiologist  Preanesthetic Checklist Completed: patient identified, site marked, surgical consent, pre-op evaluation, timeout performed, IV checked, risks and benefits discussed and monitors and equipment checked Spinal Block Patient position: sitting Prep: site prepped and draped and DuraPrep Patient monitoring: heart rate, cardiac monitor, continuous pulse ox and blood pressure Approach: midline Location: L3-4 Injection technique: single-shot Needle Needle type: Sprotte  Needle gauge: 24 G Needle length: 9 cm Assessment Sensory level: T4 Additional Notes Patient tolerated procedure well. Adequate sensory level.

## 2012-11-21 NOTE — Transfer of Care (Signed)
Immediate Anesthesia Transfer of Care Note  Patient: Shari Chang  Procedure(s) Performed: Procedure(s) (LRB) with comments: CESAREAN SECTION (N/A)  Patient Location: PACU  Anesthesia Type:Spinal  Level of Consciousness: awake, alert  and oriented  Airway & Oxygen Therapy: Patient Spontanous Breathing  Post-op Assessment: Report given to PACU RN and Post -op Vital signs reviewed and stable  Post vital signs: Reviewed and stable  Complications: No apparent anesthesia complications

## 2012-11-22 DIAGNOSIS — Z98891 History of uterine scar from previous surgery: Secondary | ICD-10-CM

## 2012-11-22 DIAGNOSIS — O9902 Anemia complicating childbirth: Secondary | ICD-10-CM | POA: Diagnosis not present

## 2012-11-22 HISTORY — DX: History of uterine scar from previous surgery: Z98.891

## 2012-11-22 LAB — CBC
MCH: 30.9 pg (ref 26.0–34.0)
MCHC: 33 g/dL (ref 30.0–36.0)
MCV: 93.6 fL (ref 78.0–100.0)
Platelets: 233 10*3/uL (ref 150–400)
RDW: 13.6 % (ref 11.5–15.5)
WBC: 9.2 10*3/uL (ref 4.0–10.5)

## 2012-11-22 MED ORDER — POLYSACCHARIDE IRON COMPLEX 150 MG PO CAPS
150.0000 mg | ORAL_CAPSULE | Freq: Every day | ORAL | Status: DC
  Filled 2012-11-22: qty 1

## 2012-11-22 NOTE — Addendum Note (Signed)
Addendum  created 11/22/12 1511 by Len Blalock, CRNA   Modules edited:Notes Section

## 2012-11-22 NOTE — Progress Notes (Signed)
Subjective: Postpartum Day 1: Cesarean Delivery secondary of IOL with TOLAC for VBAC  Patient reports incisional pain, + flatus and no problems voiding.  +BM no complaints, up ad lib without syncope Pain well controlled with po meds, using motrin and percocet  BF: on demand Mood stable, bonding well.   Objective: Vital signs in last 24 hours: Temp:  [97.8 F (36.6 C)-98.6 F (37 C)] 98.2 F (36.8 C) (12/08 0630) Pulse Rate:  [80-102] 89  (12/08 0630) Resp:  [13-21] 18  (12/08 0630) BP: (94-125)/(46-75) 108/70 mmHg (12/08 0630) SpO2:  [97 %-100 %] 98 % (12/08 0630)  Physical Exam:  General: alert, cooperative and no distress Heart: RRR Lungs: CTAB Abdomen: BS x4 Uterine Fundus: firm, -2/u Incision: healing well, no significant drainage, no dehiscence, no significant erythema. To have shower later today and to take down dressing. To apply dry dressing over incision site to prevent friction Lochia: appropriate DVT Evaluation: No evidence of DVT seen on physical exam. Negative Homan's sign. No cords or calf tenderness. No significant calf/ankle edema.   Basename 11/22/12 0550 11/20/12 2110  HGB 10.1* 11.2*  HCT 30.6* 33.0*   Anemia of pregnancy. To commence on Niferex 150 mg po daily. Assessment/Plan: Status post Cesarean section. Doing well postoperatively.  Continue current care.   Shuronda Santino, CNM. 11/22/2012, 10:31 AM

## 2012-11-22 NOTE — Anesthesia Postprocedure Evaluation (Signed)
  Anesthesia Post-op Note  Patient: Shari Chang  Procedure(s) Performed: Procedure(s) (LRB) with comments: CESAREAN SECTION (N/A)  Patient Location: PACU and Mother/Baby  Anesthesia Type:Spinal  Level of Consciousness: awake, alert  and oriented  Airway and Oxygen Therapy: Patient Spontanous Breathing     Post-op Assessment: Patient's Cardiovascular Status Stable and Respiratory Function Stable  Post-op Vital Signs: stable  Complications: No apparent anesthesia complications

## 2012-11-23 ENCOUNTER — Encounter (HOSPITAL_COMMUNITY): Payer: Self-pay

## 2012-11-23 MED ORDER — IBUPROFEN 600 MG PO TABS: 600.0000 mg | ORAL_TABLET | Freq: Four times a day (QID) | ORAL | Status: AC

## 2012-11-23 MED ORDER — OXYCODONE-ACETAMINOPHEN 5-325 MG PO TABS: 1.0000 | ORAL_TABLET | ORAL | Status: AC | PRN

## 2012-11-23 NOTE — Discharge Summary (Signed)
POSTOPERATIVE DISCHARGE SUMMARY:  Patient ID: Shari Chang MRN: [id number] DOB/AGE: [birth year] 31 y.o.  Admit date: 11/20/2012 Discharge date:  11/23/2012   Admission Diagnoses: 1. Post term pregnancy for induction of labor 2. History cesarean-section  Discharge Diagnoses:   Term Pregnancy-delivered and repeat cesarean section for failed trial of labor after cesarean  Prenatal history: G2P2002   EDC : 11/03/2012, by Other Basis  Prenatal care at [facility] since [redacted] weeks gestation  Prenatal course complicated by previous c-section and post term pregnancy  Prenatal Labs: ABO, Rh: A (05/12 0000)  Antibody: NEG (12/06 2110) Rubella: Immune (05/12 0000)  RPR: NON REACTIVE (12/06 2110)  HBsAg: Negative (05/12 0000)  HIV: Non-reactive, Non-reactive (05/12 0000)  GBS: Positive (10/18 0000)  1 hr Glucola : normal   Medical / Surgical History :  Past medical history:  Past Medical History  Diagnosis Date  . Status post repeat low transverse cesarean section - 11/7 11/22/2012    TOLAC - working towards VBAC     Past surgical history:  Past Surgical History  Procedure Date  . Cesarean section 2011    Family History:  Family History  Problem Relation Age of Onset  . Asthma Sister   . Endometriosis Sister   . Rheum arthritis Sister   . Endometriosis Mother   . Cancer Father     testicular cancer  . Endometriosis Maternal Grandmother     Social History:  reports that she has never smoked. She has never used smokeless tobacco. She reports that she does not drink alcohol or use illicit drugs.   Allergies: Review of patient's allergies indicates no known allergies.    Current Medications at time of admission:  Prescriptions prior to admission  Medication Sig Dispense Refill  . calcium carbonate (TUMS - DOSED IN MG ELEMENTAL CALCIUM) 500 MG chewable tablet Chew 1 tablet by mouth daily. For heartburn.      . fish oil-omega-3 fatty acids 1000 MG  capsule Take 2 g by mouth daily.      . Prenatal Vit-Fe Fumarate-FA (PRENATAL MULTIVITAMIN) TABS Take 1 tablet by mouth daily.      . [DISCONTINUED] OVER THE COUNTER MEDICATION Take 1 tablet by mouth daily. Blue Cohosh Tablets         Admit labs: Results for LACRETIA, TINDALL (MRN [id number]) as of 11/23/2012 10:02  11/20/2012 21:10  WBC 9.9  Hemoglobin 11.2 (L)  HCT 33.0 (L)  Platelets 253     Intrapartum Course:  Patient underwent cervical balloon ripening of cervix overnight and artificial rupture of membranes in the morning. Fetal monitoring reassuring throughout trial of labor. Patient did not progress past 3.5 cm dilation despite change in position and nipple stimulation. Patient declined augmentation of labor with Pitocin. She was counseled about risk and benefit of repeat c-section and agreed to procedure.  Procedures: Cesarean section delivery of female newborn by Dr Ernestina Penna  See operative report for further details  Postoperative / postpartum course: uneventful  Physical Exam:  VSS: Blood pressure 112/67, pulse 80, temperature 97.9 F (36.6 C), temperature source Oral, resp. rate 18, height 5\' 7"  (1.702 m), weight 79.833 kg (176 lb), SpO2 99.00%, unknown if currently breastfeeding.   LABS:  Basename 11/22/12 0550 11/20/12 2110  WBC 9.2 9.9  HGB 10.1* 11.2*  HCT 30.6* 33.0*  PLT 233 253    I&O: I/O last 3 completed shifts: In: 2400 [P.O.:2400] Out: 5800 [Urine:5800]      Incision:  approximated with sutures / no  erythema / no ecchymosis / no drainage opsite for removal on post-op day 3 by patient at home.  Discharge Instructions:  Discharged Condition: good Activity: pelvic rest, weight lifting and driving restrictions x 2 weeks Diet: routine Medications:    Medication List     As of 11/23/2012 10:00 AM    STOP taking these medications         OVER THE COUNTER MEDICATION      TAKE these medications         calcium carbonate 500 MG chewable tablet    Commonly known as: TUMS - dosed in mg elemental calcium   Chew 1 tablet by mouth daily. For heartburn.      fish oil-omega-3 fatty acids 1000 MG capsule   Take 2 g by mouth daily.      ibuprofen 600 MG tablet   Commonly known as: ADVIL,MOTRIN   Take 1 tablet (600 mg total) by mouth every 6 (six) hours.      oxyCODONE-acetaminophen 5-325 MG per tablet   Commonly known as: PERCOCET/ROXICET   Take 1-2 tablets by mouth every 4 (four) hours as needed (moderate - severe pain).      prenatal multivitamin Tabs   Take 1 tablet by mouth daily.       Condition: stable Postpartum Instructions: refer to practice specific booklet Discharge to: home Disposition: Final discharge disposition not confirmed Follow up :      Follow-up Information    Follow up with Marlinda Mike, CNM. Schedule an appointment as soon as possible for a visit in 6 weeks.   Contact information:   [address] [place] [address] [phone]           Signed: Arlan Organ 11/23/2012, 10:00 AM

## 2012-11-23 NOTE — Progress Notes (Signed)
Post discharge chart review completed.  

## 2012-11-23 NOTE — Progress Notes (Signed)
Subjective: POD# 2 Information for the patient's newborn:  Shari Chang, Shari Chang [[id number]]  female  / circ completed  Reports feeling well, desires early DC Feeding: breast Patient reports tolerating PO.  Breast symptoms: + milk, no difficulties Pain controlled with Motrin and few Percocet. Denies HA/SOB/C/P/N/V/dizziness. Flatus present. She reports vaginal bleeding as normal, without clots.  She is ambulating, urinating without difficult.     Objective:   VS:  Filed Vitals:   11/22/12 1500 11/22/12 1530 11/22/12 2108 11/23/12 0551  BP:  107/72 106/70 112/67  Pulse:  87 101 80  Temp:  98 F (36.7 C) 97.9 F (36.6 C) 97.9 F (36.6 C)  TempSrc:  Oral Oral Oral  Resp:  16 18 18   Height:      Weight:      SpO2: 99%        Intake/Output Summary (Last 24 hours) at 11/23/12 0942 Last data filed at 11/22/12 1230  Gross per 24 hour  Intake      0 ml  Output   1000 ml  Net  -1000 ml        Basename 11/22/12 0550 11/20/12 2110  WBC 9.2 9.9  HGB 10.1* 11.2*  HCT 30.6* 33.0*  PLT 233 253     Blood type: --/--/A POS (12/06 2110)  Rubella: Immune (05/12 0000)   TDaP and flu done   Physical Exam:  General: alert, cooperative and no distress CV: Regular rate and rhythm Resp: clear Abdomen: soft, nontender, normal bowel sounds Incision: clean, dry, intact and opsite dressing in place, suture skin closure Uterine Fundus: firm, below umbilicus, nontender Lochia: minimal Ext: Homans sign is negative, no sign of DVT and no edema, redness or tenderness in the calves or thighs      Assessment/Plan: 31 y.o.   POD# 2. [id number]                  Active Problems:  Status post repeat low transverse cesarean section - 11/7  Failed TOLAC / LGA  Anemia of mother in pregnancy, delivered - mild and asymptomatic  Doing well, stable.    Routine post-op care DC home w/ WOB instructions Remove opsite dressing tomorrow  Shari Chang 11/23/2012, 9:42 AM

## 2014-01-10 ENCOUNTER — Other Ambulatory Visit: Payer: Self-pay | Admitting: Family

## 2014-01-10 ENCOUNTER — Ambulatory Visit
Admission: RE | Admit: 2014-01-10 | Discharge: 2014-01-10 | Disposition: A | Discharge status: Home / Self Care | Payer: BC Managed Care – PPO | Source: Ambulatory Visit | Attending: Family | Admitting: Family

## 2014-01-10 DIAGNOSIS — R109 Unspecified abdominal pain: Secondary | ICD-10-CM

## 2014-01-10 DIAGNOSIS — K5900 Constipation, unspecified: Secondary | ICD-10-CM

## 2014-08-24 IMAGING — CR DG ABDOMEN 1V
1 series · 1 of 1 positions shown · non-contrast
Comparison: None.

CLINICAL DATA: Epigastric and umbilical pain.

EXAM:
ABDOMEN - 1 VIEW

[view not recorded]
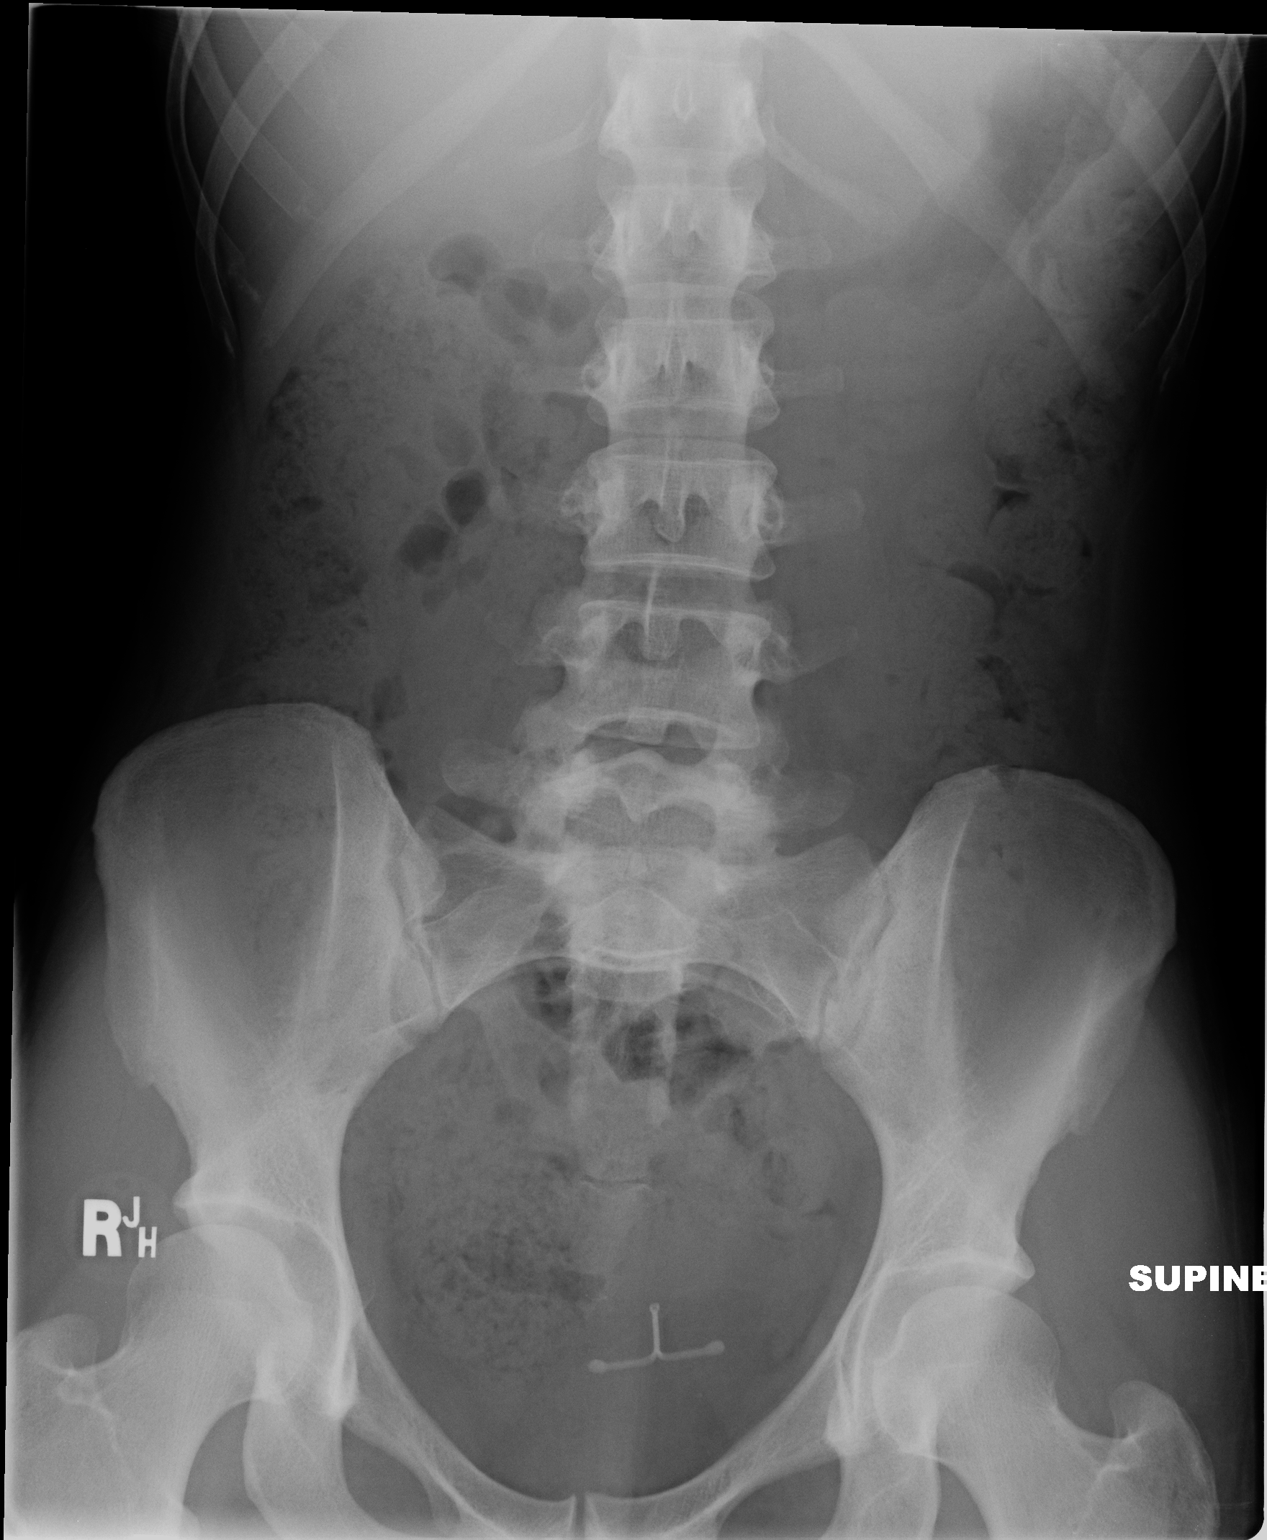

[1 of 1 positions shown; findings below may reference images not displayed]

FINDINGS: Stool is seen throughout the colon. No unexpected radiopaque
calculi. Intrauterine contraceptive device is seen in the anatomic
pelvis.
IMPRESSION: Bowel gas pattern is indicative of constipation.

## 2014-10-17 ENCOUNTER — Encounter (HOSPITAL_COMMUNITY): Payer: Self-pay

## 2015-06-28 ENCOUNTER — Other Ambulatory Visit (HOSPITAL_COMMUNITY): Payer: Self-pay | Admitting: Physician Assistant

## 2015-06-28 DIAGNOSIS — R102 Pelvic and perineal pain: Secondary | ICD-10-CM

## 2015-06-28 DIAGNOSIS — R109 Unspecified abdominal pain: Secondary | ICD-10-CM

## 2015-07-03 ENCOUNTER — Other Ambulatory Visit (HOSPITAL_COMMUNITY): Payer: 59

## 2015-07-03 ENCOUNTER — Ambulatory Visit (HOSPITAL_COMMUNITY): Payer: 59

## 2015-07-04 ENCOUNTER — Ambulatory Visit (HOSPITAL_COMMUNITY): Payer: PRIVATE HEALTH INSURANCE

## 2021-01-03 ENCOUNTER — Ambulatory Visit (INDEPENDENT_AMBULATORY_CARE_PROVIDER_SITE_OTHER): Payer: No Typology Code available for payment source | Admitting: Licensed Clinical Social Worker

## 2021-01-03 DIAGNOSIS — F411 Generalized anxiety disorder: Secondary | ICD-10-CM | POA: Diagnosis not present
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
## 2021-01-24 ENCOUNTER — Ambulatory Visit (HOSPITAL_COMMUNITY): Payer: PRIVATE HEALTH INSURANCE | Admitting: Licensed Clinical Social Worker

## 2021-02-07 ENCOUNTER — Ambulatory Visit (INDEPENDENT_AMBULATORY_CARE_PROVIDER_SITE_OTHER): Payer: No Typology Code available for payment source | Admitting: Licensed Clinical Social Worker

## 2021-02-07 DIAGNOSIS — F411 Generalized anxiety disorder: Secondary | ICD-10-CM | POA: Diagnosis not present

## 2021-02-08 NOTE — Progress Notes (Signed)
Virtual Visit via Video Note  I connected with Shari Chang on 02/08/21 at  1:00 PM EST by a video enabled telemedicine application and verified that I am speaking with the correct person using two identifiers.  Location: Patient: Home Provider: Home   I discussed the limitations of evaluation and management by telemedicine and the availability of in person appointments. The patient expressed understanding and agreed to proceed.  THERAPIST PROGRESS NOTE  Session Time: 1:00 pm-1:45 pm  Type of Therapy: Individual Therapy   Treatment Goals addressed: "Shari Chang will manage anxiety as evidenced by returning to a previous level of functioning while identifying her new self, managing and challenging anxious thoughts, managing daily stressors, and increase connection to husband for 5 out of 7 days for 60 days."  Interventions: Therapist utilized CBT, Solution Focused brief therapy, and Poly vagal theory to address anxiety. Therapist provided support and empathy to patient while she shared her thoughts and feelings during session. Therapist mapped patient's response of her ventral vagal and her sympathetic nervous system in relation to triggers and working on calming the response to reduce anxiety response. Therapist discussed checking in with the physical body especially with jaw tension to prevent tension from building. Therapist committed patient to continue to bring awareness to her physical symptoms of anxiety and catch them early.   Effectiveness:  Patient was oriented x5 (person, place, situation, time, and object). Patient was dressed neatly, and groomed appropriately. Patient was able to identify different responses of her nervous system . When she is calm/safe/connected (ventral vagal) she feels happy and doesn't focus on the "world." This state is similar to when she is at [employer] or nature. She is able to access this by walking, talking with family, and using grounding techniques. Patient  could recognize the response of her sympathetic nervous system which includes stomach issues, sore back, jaw tension, and brain in overdrive. She feels inadequate and the world feels tulmutious. It feels like a gray, drab, concrete city. Patient was able to identify situations that trigger her sympathetic nervous system but also understood she could access that "beach" feeling of the ventral vagal when she is feeling triggered. Patient also understood the importance of "checking in" on her physical body throughout the day to prevent jaw tension. Patient will also work on filtering some of her tasks through the funnel of Importance, Passion, and Urgency to identify what to do first and reduce her sympathetic nervous system.   Patient was engaged in session. Patient responded well to interventions. Patient continues to meet criteria for Generalized Anxiety Disorder. She will continue in outpatient therapy due to being the least restrictive service to meet her needs. Patient made minimal progress on her goals.   Suicidal/Homicidal: Negativewithout intent/plan  Plan: Return again in 1-2 weeks.  Diagnosis: Axis I: Generalized Anxiety Disorder    Axis II: No diagnosis   I discussed the assessment and treatment plan with the patient. The patient was provided an opportunity to ask questions and all were answered. The patient agreed with the plan and demonstrated an understanding of the instructions.   The patient was advised to call back or seek an in-person evaluation if the symptoms worsen or if the condition fails to improve as anticipated.  I provided 45 minutes of non-face-to-face time during this encounter.   Bynum Bellows, LCSW 02/08/2021

## 2021-02-26 ENCOUNTER — Ambulatory Visit (INDEPENDENT_AMBULATORY_CARE_PROVIDER_SITE_OTHER): Payer: No Typology Code available for payment source | Admitting: Licensed Clinical Social Worker

## 2021-02-26 DIAGNOSIS — F411 Generalized anxiety disorder: Secondary | ICD-10-CM | POA: Diagnosis not present

## 2021-02-26 NOTE — Progress Notes (Signed)
Virtual Visit via Video Note  I connected with Shari Chang on 02/26/21 at  1:00 PM EDT by a video enabled telemedicine application and verified that I am speaking with the correct person using two identifiers.  Location: Patient: Home Provider: Home   I discussed the limitations of evaluation and management by telemedicine and the availability of in person appointments. The patient expressed understanding and agreed to proceed.  THERAPIST PROGRESS NOTE  Session Time: 1:00 pm-1:50 pm  Type of Therapy: Individual Therapy   Session#2  Treatment Goals addressed: "Shari Chang will manage anxiety as evidenced by returning to a previous level of functioning while identifying her new self, managing and challenging anxious thoughts, managing daily stressors, and increase connection to husband for 5 out of 7 days for 60 days."  Interventions: Therapist utilized CBT and Solution focused brief therapy to address anxiety. Therapist provided space for patient to share her thoughts and feelings without judgement in session. Therapist had patient identify pre-session change/improvements. Therapist worked with patient in identifying pros and cons related to stopping work and identifying what gives her meaning/value with her work as well as life.   Effectiveness:  Patient was oriented x5 (person, place, situation, time, and object). Patient was dressed casually, and appropriately groomed. Patient was alert, engaged, and pleasant. Patient noted that mood and anxiety have improved. She has changed how she has talked to herself, and set one day a  week aside to do something such as yoga, read a book, or take a bath. She has hired a  Dispensing optician so that she can enjoy this time. Patient has also been letting go of some things she felt like she had to do or at least the pace she thought she had to get things done which has decreased her anxiety. 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 She doesn't really need to work financially but does get satisfaction from her work. Patient is considering working part time to still work and spend more time at home with her children.   Patient was engaged in session. Patient responded well to interventions. Patient continues to meet criteria for Generalized Anxiety Disorder. She will continue in outpatient therapy due to being the least restrictive service to meet her needs. Patient made minimal progress on her goals.   Suicidal/Homicidal: Negativewithout intent/plan  Plan: Return again in 1-2 weeks.  Diagnosis: Axis I: Generalized Anxiety Disorder    Axis II: No diagnosis   I discussed the assessment and treatment plan with the patient. The patient was provided an opportunity to ask questions and all were answered. The patient agreed with the plan and demonstrated an understanding of the instructions.   The patient was advised to call back or seek an in-person evaluation if the symptoms worsen or if the condition fails to improve as anticipated.  I provided 50 minutes of non-face-to-face time during this encounter.   Bynum Bellows, LCSW 02/26/2021

## 2021-03-14 ENCOUNTER — Ambulatory Visit (HOSPITAL_COMMUNITY): Payer: PRIVATE HEALTH INSURANCE | Admitting: Licensed Clinical Social Worker

## 2021-04-09 ENCOUNTER — Ambulatory Visit (INDEPENDENT_AMBULATORY_CARE_PROVIDER_SITE_OTHER): Payer: No Typology Code available for payment source | Admitting: Licensed Clinical Social Worker

## 2021-04-09 DIAGNOSIS — F411 Generalized anxiety disorder: Secondary | ICD-10-CM

## 2021-04-10 NOTE — Progress Notes (Signed)
Virtual Visit via Video Note  I connected with Shari Chang on 04/10/21 at  3:00 PM EDT by a video enabled telemedicine application and verified that I am speaking with the correct person using two identifiers.  Location: Patient: Home Provider: Home   I discussed the limitations of evaluation and management by telemedicine and the availability of in person appointments. The patient expressed understanding and agreed to proceed.  THERAPIST PROGRESS NOTE  Session Time: 3:00 pm-3:46 pm  Type of Therapy: Individual Therapy   Session#3  Treatment Goals addressed: "Shari Chang will manage anxiety as evidenced by returning to a previous level of functioning while identifying her new self, managing and challenging anxious thoughts, managing daily stressors, and increase connection to husband for 5 out of 7 days for 60 days."  Interventions: Therapist utilized CBT and Solution focused brief therapy to address anxiety. Therapist provided support and space for patient to share her thoughts and feelings. Therapist had patient identify pre-session changes. Therapist discussed the mental and physical benefits of being in nature to regulate the nervous system. Therapist worked with patient to identify small steps to take to manage anxiety.    Effectiveness:  Patient was oriented x5 (person, place, situation, time, and object). Patient was dressed casually, and appropriately groomed. Patient was alert, engaged, and pleasant. Patient has been doing well. She was recently at the beach and was able to relax. She has some flexibility at work and is taking advantage f that to reduce her busy day at work. 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 Patient noted that her husband is considering taking an executive job which would cause them to move again. Patient feels like she just got settled into the home. Patient is open to her husband pursuing this but would also like stability.  Patient understood the benefits of getting outside and being in "nature" to regulate mood as well as he physical benefits. She is going to continue to get outside, take a walk on her lunch break, etc.   Patient was engaged in session. Patient responded well to interventions. Patient continues to meet criteria for Generalized Anxiety Disorder. She will continue in outpatient therapy due to being the least restrictive service to meet her needs. Patient made moderate progress on her goals.   Suicidal/Homicidal: Negativewithout intent/plan  Plan: Return again in 1-2 weeks.  Diagnosis: Axis I: Generalized Anxiety Disorder    Axis II: No diagnosis   I discussed the assessment and treatment plan with the patient. The patient was provided an opportunity to ask questions and all were answered. The patient agreed with the plan and demonstrated an understanding of the instructions.   The patient was advised to call back or seek an in-person evaluation if the symptoms worsen or if the condition fails to improve as anticipated.  I provided 46 minutes of non-face-to-face time during this encounter.   Shari Bellows, LCSW 04/10/2021

## 2023-05-31 ENCOUNTER — Telehealth: Payer: Self-pay | Admitting: Physician Assistant

## 2023-05-31 DIAGNOSIS — N390 Urinary tract infection, site not specified: Secondary | ICD-10-CM

## 2023-05-31 MED ORDER — PHENAZOPYRIDINE HCL 100 MG PO TABS: 100.0000 mg | ORAL_TABLET | Freq: Three times a day (TID) | ORAL | 0 refills | Status: AC | PRN

## 2023-05-31 MED ORDER — NITROFURANTOIN MONOHYD MACRO 100 MG PO CAPS: 100.0000 mg | ORAL_CAPSULE | Freq: Two times a day (BID) | ORAL | 0 refills | Status: AC

## 2023-05-31 NOTE — Progress Notes (Signed)
E-Visit for Urinary Problems  We are sorry that you are not feeling well.  Here is how we plan to help!  Based on what you shared with me it looks like you most likely have a simple urinary tract infection.  A UTI (Urinary Tract Infection) is a bacterial infection of the bladder.  Most cases of urinary tract infections are simple to treat but a key part of your care is to encourage you to drink plenty of fluids and watch your symptoms carefully.  I have prescribed MacroBid 100 mg twice a day for 5 days.  Your symptoms should gradually improve. Call us if the burning in your urine worsens, you develop worsening fever, back pain or pelvic pain or if your symptoms do not resolve after completing the antibiotic.  Urinary tract infections can be prevented by drinking plenty of water to keep your body hydrated.  Also be sure when you wipe, wipe from front to back and don't hold it in!  If possible, empty your bladder every 4 hours.  HOME CARE Drink plenty of fluids Compete the full course of the antibiotics even if the symptoms resolve Remember, when you need to go.go. Holding in your urine can increase the likelihood of getting a UTI! GET HELP RIGHT AWAY IF: You cannot urinate You get a high fever Worsening back pain occurs You see blood in your urine You feel sick to your stomach or throw up You feel like you are going to pass out  MAKE SURE YOU  Understand these instructions. Will watch your condition. Will get help right away if you are not doing well or get worse.   Thank you for choosing an e-visit.  Your e-visit answers were reviewed by a board certified advanced clinical practitioner to complete your personal care plan. Depending upon the condition, your plan could have included both over the counter or prescription medications.  Please review your pharmacy choice. Make sure the pharmacy is open so you can pick up prescription now. If there is a problem, you may contact your  provider through MyChart messaging and have the prescription routed to another pharmacy.  Your safety is important to us. If you have drug allergies check your prescription carefully.   For the next 24 hours you can use MyChart to ask questions about today's visit, request a non-urgent call back, or ask for a work or school excuse. You will get an email in the next two days asking about your experience. I hope that your e-visit has been valuable and will speed your recovery.   I have spent 5 minutes in review of e-visit questionnaire, review and updating patient chart, medical decision making and response to patient.   Deyana Wnuk Z Ward, PA-C    

## 2023-11-23 ENCOUNTER — Telehealth: Payer: BC Managed Care – PPO | Admitting: Physician Assistant

## 2023-11-23 DIAGNOSIS — R6889 Other general symptoms and signs: Secondary | ICD-10-CM | POA: Diagnosis not present

## 2023-11-23 MED ORDER — FLUTICASONE PROPIONATE 50 MCG/ACT NA SUSP: 2.0000 | Freq: Every day | NASAL | 6 refills | Status: AC

## 2023-11-23 MED ORDER — CETIRIZINE-PSEUDOEPHEDRINE ER 5-120 MG PO TB12: 1.0000 | ORAL_TABLET | Freq: Two times a day (BID) | ORAL | 0 refills | Status: AC

## 2023-11-23 MED ORDER — BENZONATATE 100 MG PO CAPS: 100.0000 mg | ORAL_CAPSULE | Freq: Two times a day (BID) | ORAL | 0 refills | Status: AC | PRN

## 2023-11-23 NOTE — Progress Notes (Signed)

## 2023-11-23 NOTE — Progress Notes (Signed)
I have spent 5 minutes in review of e-visit questionnaire, review and updating patient chart, medical decision making and response to patient.   Laure Kidney, PA-C

## 2024-07-30 ENCOUNTER — Other Ambulatory Visit: Payer: Self-pay | Admitting: Medical Genetics

## 2024-09-23 ENCOUNTER — Other Ambulatory Visit (HOSPITAL_COMMUNITY)
Admission: RE | Admit: 2024-09-23 | Discharge: 2024-09-23 | Disposition: A | Discharge status: Home / Self Care | Payer: Self-pay | Source: Ambulatory Visit | Attending: Medical Genetics | Admitting: Medical Genetics

## 2024-10-02 LAB — GENECONNECT MOLECULAR SCREEN: Genetic Analysis Overall Interpretation: NEGATIVE
